# Patient Record
Sex: Female | Born: 1955 | Race: White | Hispanic: No | Marital: Married
Health system: Southern US, Community
[De-identification: ages and names within clinical notes are randomized; demographics above are authoritative.]

## PROBLEM LIST (undated history)

## (undated) HISTORY — PX: BREAST EXCISIONAL BIOPSY: SUR124

## (undated) HISTORY — PX: BREAST BIOPSY: SHX20

---

## 2015-02-05 ENCOUNTER — Other Ambulatory Visit: Payer: Self-pay

## 2015-02-05 DIAGNOSIS — Z1231 Encounter for screening mammogram for malignant neoplasm of breast: Secondary | ICD-10-CM

## 2015-02-25 ENCOUNTER — Ambulatory Visit
Admission: RE | Admit: 2015-02-25 | Discharge: 2015-02-25 | Disposition: A | Discharge status: Home / Self Care | Payer: PRIVATE HEALTH INSURANCE | Source: Ambulatory Visit

## 2015-02-25 DIAGNOSIS — Z1231 Encounter for screening mammogram for malignant neoplasm of breast: Secondary | ICD-10-CM

## 2016-02-10 ENCOUNTER — Other Ambulatory Visit: Payer: Self-pay | Admitting: Family Medicine

## 2016-02-10 DIAGNOSIS — Z1231 Encounter for screening mammogram for malignant neoplasm of breast: Secondary | ICD-10-CM

## 2016-03-31 ENCOUNTER — Ambulatory Visit
Admission: RE | Admit: 2016-03-31 | Discharge: 2016-03-31 | Disposition: A | Discharge status: Home / Self Care | Payer: No Typology Code available for payment source | Source: Ambulatory Visit | Attending: Family Medicine | Admitting: Family Medicine

## 2016-03-31 DIAGNOSIS — Z1231 Encounter for screening mammogram for malignant neoplasm of breast: Secondary | ICD-10-CM

## 2016-11-23 ENCOUNTER — Other Ambulatory Visit: Payer: Self-pay | Admitting: Physician Assistant

## 2016-11-23 DIAGNOSIS — M8580 Other specified disorders of bone density and structure, unspecified site: Secondary | ICD-10-CM

## 2017-03-22 ENCOUNTER — Other Ambulatory Visit: Payer: Self-pay | Admitting: Family Medicine

## 2017-03-22 DIAGNOSIS — Z1231 Encounter for screening mammogram for malignant neoplasm of breast: Secondary | ICD-10-CM

## 2017-04-09 ENCOUNTER — Other Ambulatory Visit: Payer: Self-pay | Admitting: Physician Assistant

## 2017-04-09 DIAGNOSIS — Z1231 Encounter for screening mammogram for malignant neoplasm of breast: Secondary | ICD-10-CM

## 2017-04-30 ENCOUNTER — Ambulatory Visit: Payer: No Typology Code available for payment source

## 2017-05-04 ENCOUNTER — Ambulatory Visit
Admission: RE | Admit: 2017-05-04 | Discharge: 2017-05-04 | Disposition: A | Discharge status: Home / Self Care | Payer: No Typology Code available for payment source | Source: Ambulatory Visit | Attending: Physician Assistant | Admitting: Physician Assistant

## 2017-05-04 DIAGNOSIS — Z1231 Encounter for screening mammogram for malignant neoplasm of breast: Secondary | ICD-10-CM

## 2017-05-04 DIAGNOSIS — M8580 Other specified disorders of bone density and structure, unspecified site: Secondary | ICD-10-CM

## 2018-03-27 ENCOUNTER — Other Ambulatory Visit: Payer: Self-pay | Admitting: Physician Assistant

## 2018-03-27 DIAGNOSIS — Z1231 Encounter for screening mammogram for malignant neoplasm of breast: Secondary | ICD-10-CM

## 2018-05-07 ENCOUNTER — Ambulatory Visit
Admission: RE | Admit: 2018-05-07 | Discharge: 2018-05-07 | Disposition: A | Discharge status: Home / Self Care | Payer: No Typology Code available for payment source | Source: Ambulatory Visit | Attending: Physician Assistant | Admitting: Physician Assistant

## 2018-05-07 DIAGNOSIS — Z1231 Encounter for screening mammogram for malignant neoplasm of breast: Secondary | ICD-10-CM

## 2019-05-30 ENCOUNTER — Other Ambulatory Visit: Payer: Self-pay | Admitting: Physician Assistant

## 2019-05-30 DIAGNOSIS — Z1231 Encounter for screening mammogram for malignant neoplasm of breast: Secondary | ICD-10-CM

## 2019-07-16 ENCOUNTER — Other Ambulatory Visit: Payer: Self-pay

## 2019-07-16 ENCOUNTER — Ambulatory Visit
Admission: RE | Admit: 2019-07-16 | Discharge: 2019-07-16 | Disposition: A | Discharge status: Home / Self Care | Payer: Medicare Other | Source: Ambulatory Visit | Attending: Physician Assistant | Admitting: Physician Assistant

## 2019-07-16 DIAGNOSIS — Z1231 Encounter for screening mammogram for malignant neoplasm of breast: Secondary | ICD-10-CM

## 2019-12-13 ENCOUNTER — Other Ambulatory Visit: Payer: Self-pay

## 2019-12-13 ENCOUNTER — Emergency Department (HOSPITAL_COMMUNITY)
Admission: EM | Admit: 2019-12-13 | Discharge: 2019-12-13 | Discharge status: Against Medical Advice | Payer: Medicare Other

## 2019-12-13 NOTE — ED Notes (Signed)
Called pt x1 for triage No answer 

## 2019-12-13 NOTE — ED Notes (Signed)
Pt gave labels to registration and left before triage

## 2020-04-03 IMAGING — MG DIGITAL SCREENING BILAT W/ TOMO W/ CAD
8 series · 9 of 24 positions shown · non-contrast
Comparison: Previous exam(s).

CLINICAL DATA: Screening.

EXAM:
DIGITAL SCREENING BILATERAL MAMMOGRAM WITH TOMO AND CAD

[L CC synth-2D]
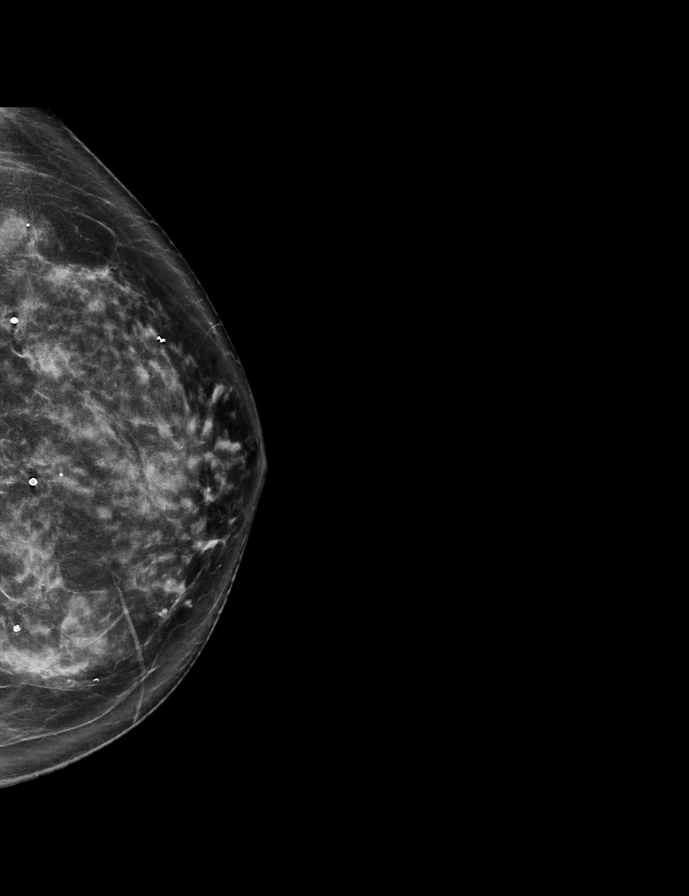

[L MLO synth-2D]
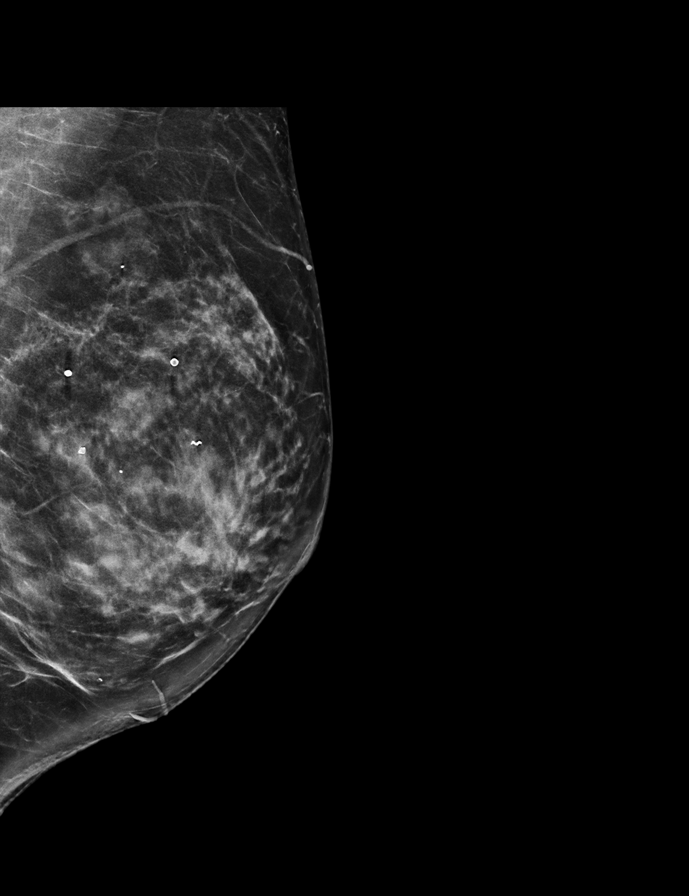

[R CC synth-2D]
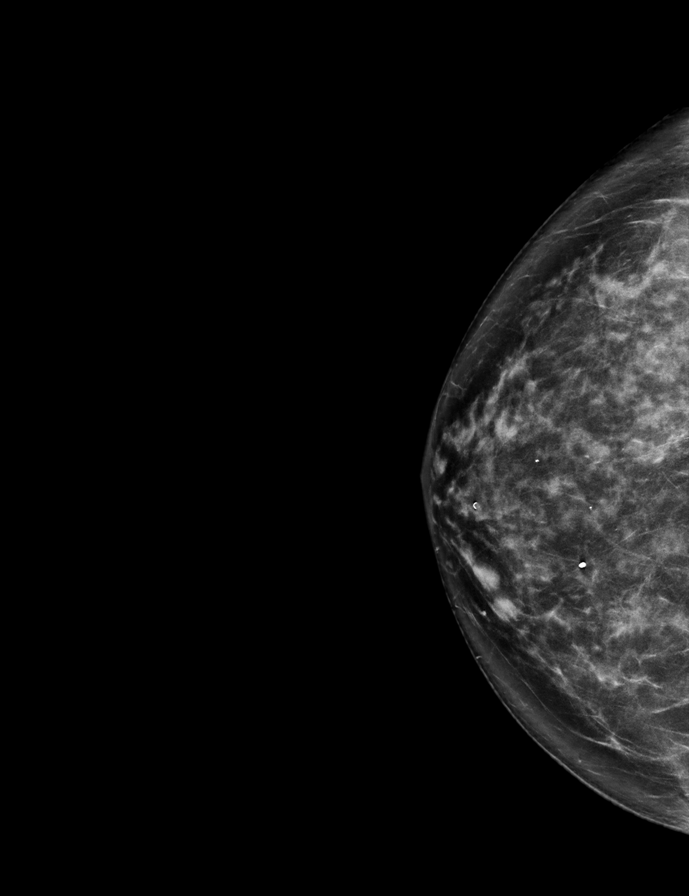

[R MLO synth-2D]
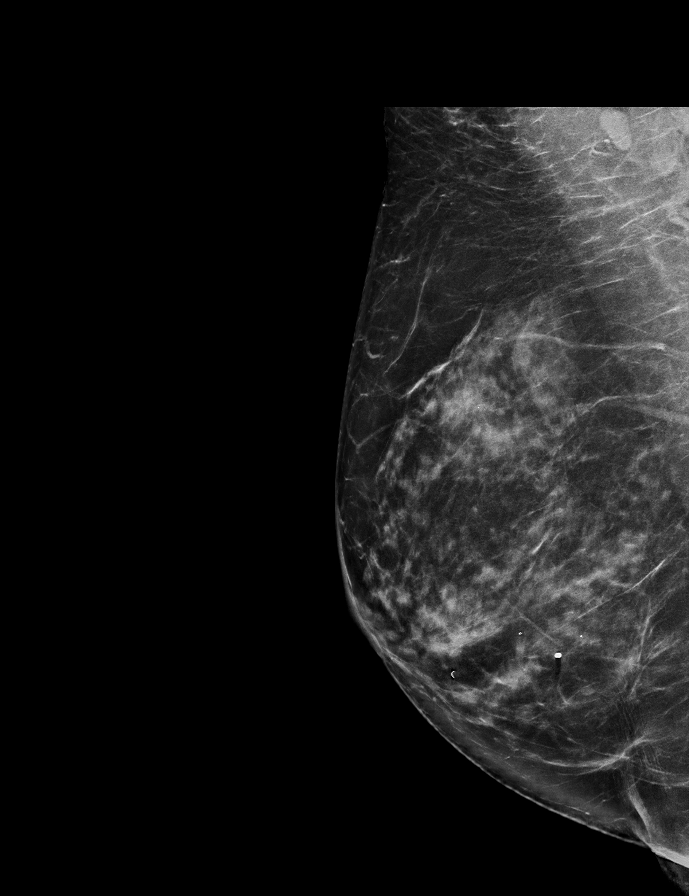

[L CC tomo · 2 of 71 frames shown]
[frame 23/71]
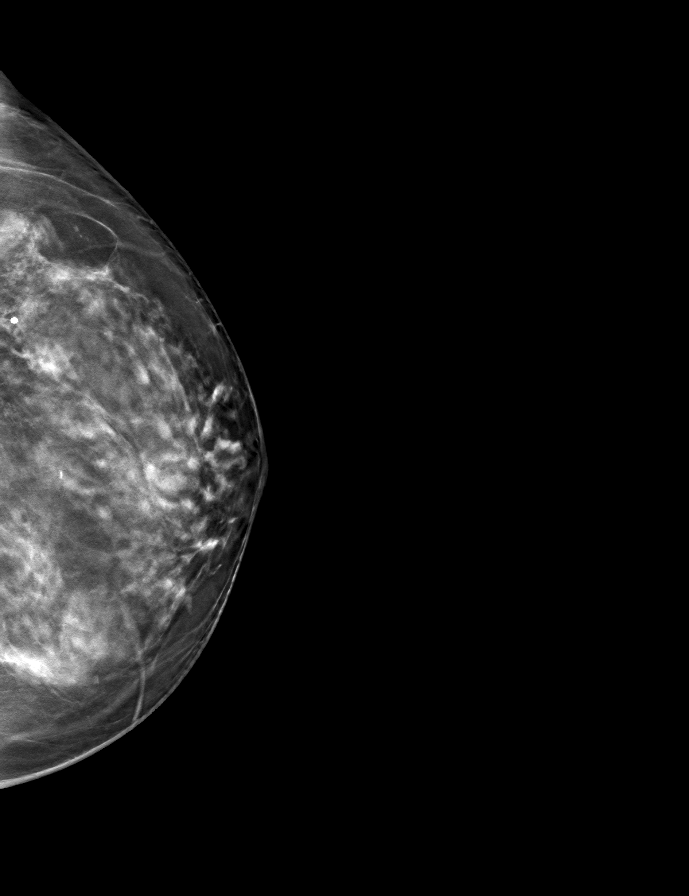
[frame 36/71]
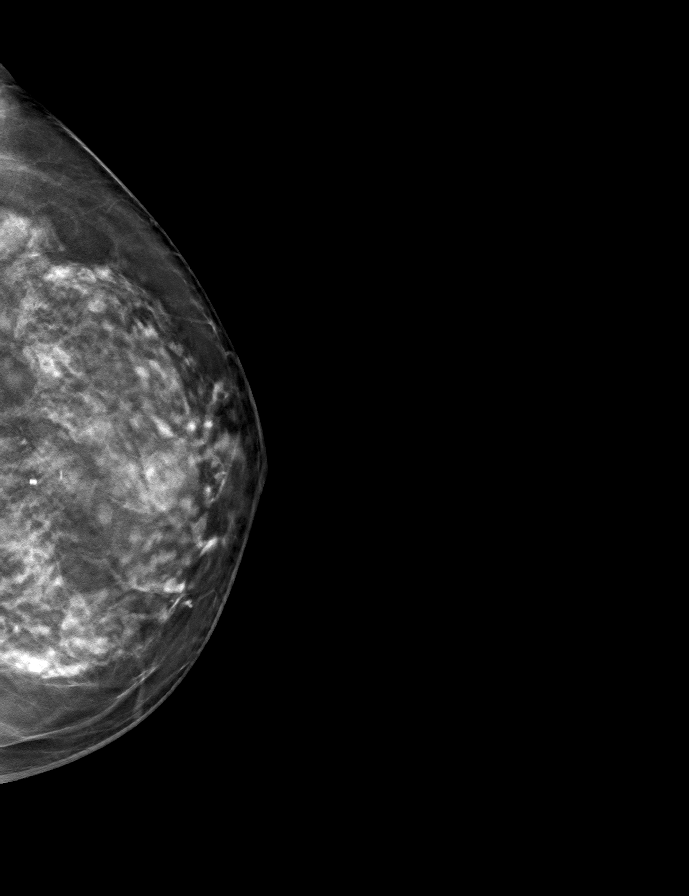

[R MLO tomo · tomo slice 38/75.0]
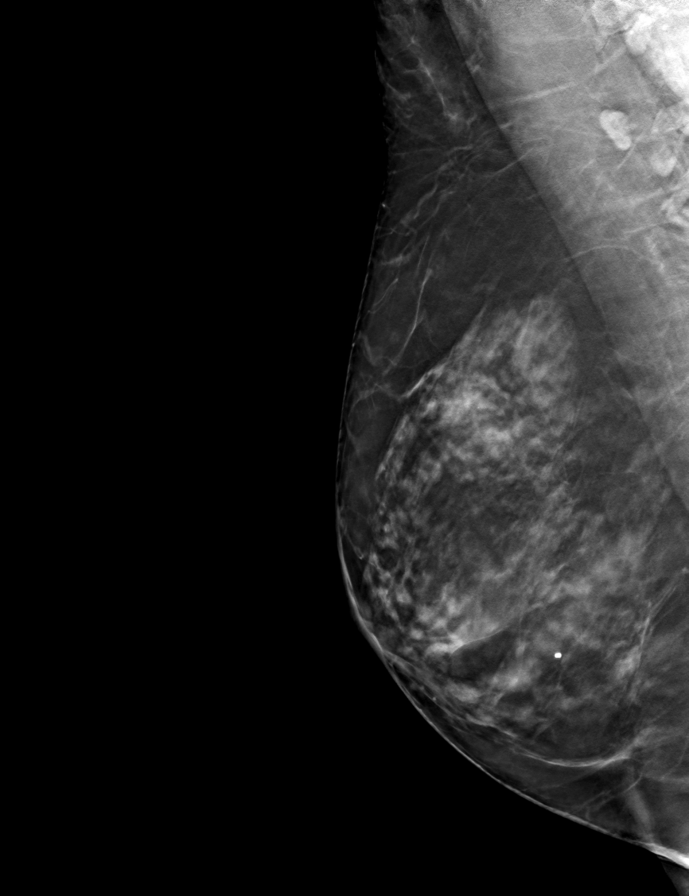

[R CC tomo · tomo slice 37/72.0]
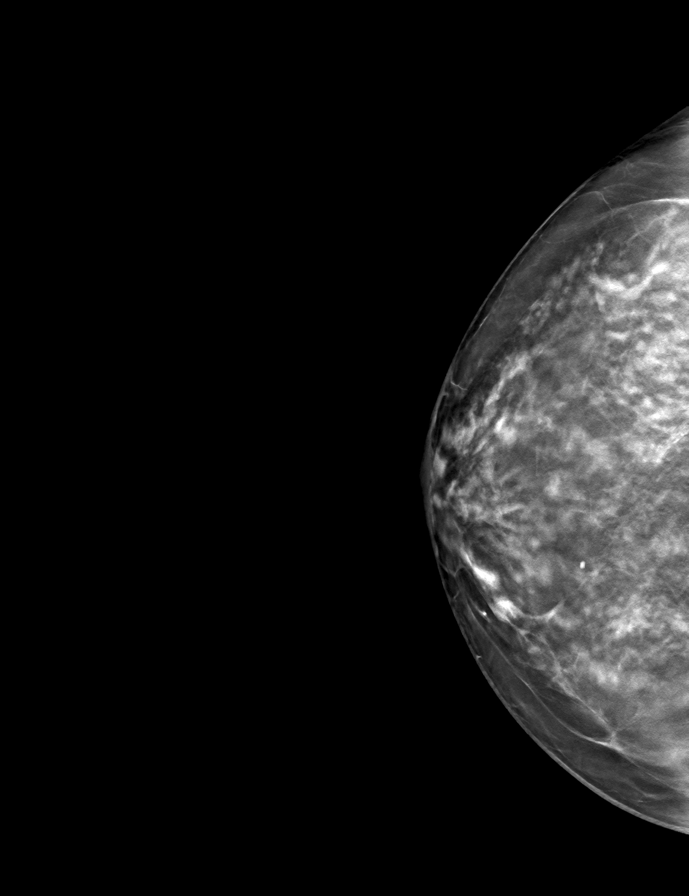

[L MLO tomo · tomo slice 35/69.0]
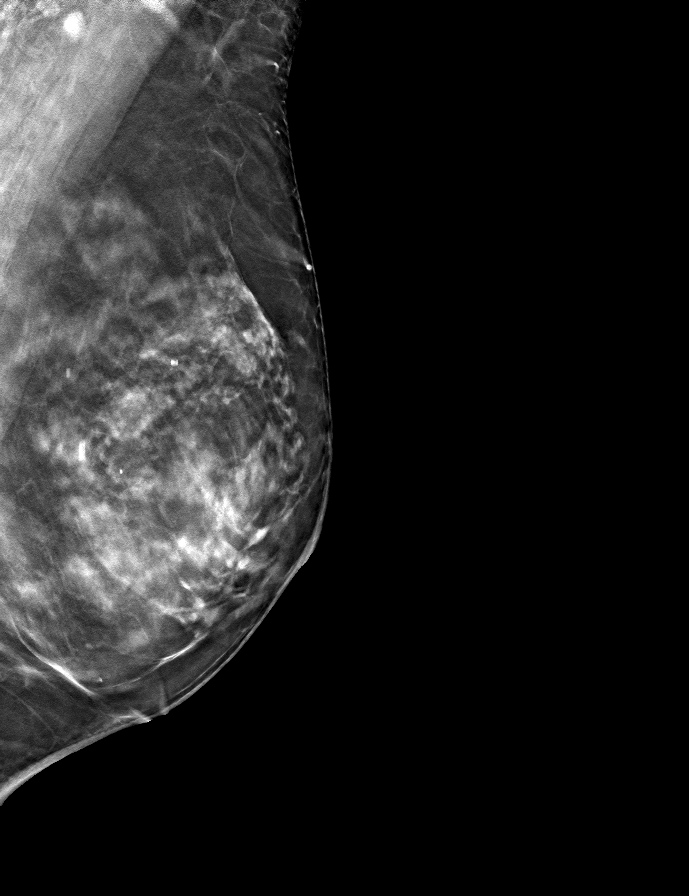

[9 of 24 positions shown; findings below may reference images not displayed]

ACR Breast Density Category c: The breast tissue is heterogeneously
dense, which may obscure small masses.
FINDINGS: There are no findings suspicious for malignancy. Images were
processed with CAD.
IMPRESSION: No mammographic evidence of malignancy. A result letter of this
screening mammogram will be mailed directly to the patient.

RECOMMENDATION:
Screening mammogram in one year. (Code:FT-U-LHB)

BI-RADS CATEGORY  1: Negative.

## 2020-06-16 ENCOUNTER — Other Ambulatory Visit: Payer: Self-pay | Admitting: Physician Assistant

## 2020-06-16 DIAGNOSIS — E2839 Other primary ovarian failure: Secondary | ICD-10-CM

## 2020-06-16 DIAGNOSIS — Z1231 Encounter for screening mammogram for malignant neoplasm of breast: Secondary | ICD-10-CM

## 2020-11-08 ENCOUNTER — Ambulatory Visit: Payer: No Typology Code available for payment source

## 2020-11-08 ENCOUNTER — Other Ambulatory Visit: Payer: No Typology Code available for payment source

## 2020-11-26 ENCOUNTER — Other Ambulatory Visit: Payer: Self-pay | Admitting: Physician Assistant

## 2020-11-26 DIAGNOSIS — E041 Nontoxic single thyroid nodule: Secondary | ICD-10-CM

## 2020-12-01 ENCOUNTER — Other Ambulatory Visit: Payer: No Typology Code available for payment source

## 2020-12-08 ENCOUNTER — Other Ambulatory Visit: Payer: Medicare Other

## 2020-12-09 ENCOUNTER — Ambulatory Visit
Admission: RE | Admit: 2020-12-09 | Discharge: 2020-12-09 | Disposition: A | Discharge status: Home / Self Care | Payer: Medicare Other | Source: Ambulatory Visit | Attending: Physician Assistant | Admitting: Physician Assistant

## 2020-12-09 ENCOUNTER — Other Ambulatory Visit: Payer: Self-pay

## 2020-12-09 DIAGNOSIS — E041 Nontoxic single thyroid nodule: Secondary | ICD-10-CM

## 2020-12-15 ENCOUNTER — Ambulatory Visit
Admission: RE | Admit: 2020-12-15 | Discharge: 2020-12-15 | Disposition: A | Discharge status: Home / Self Care | Payer: Medicare Other | Source: Ambulatory Visit | Attending: Physician Assistant | Admitting: Physician Assistant

## 2020-12-15 ENCOUNTER — Other Ambulatory Visit: Payer: Self-pay

## 2020-12-15 DIAGNOSIS — Z1231 Encounter for screening mammogram for malignant neoplasm of breast: Secondary | ICD-10-CM

## 2021-04-06 ENCOUNTER — Ambulatory Visit
Admission: RE | Admit: 2021-04-06 | Discharge: 2021-04-06 | Disposition: A | Discharge status: Home / Self Care | Payer: Medicare Other | Source: Ambulatory Visit | Attending: Physician Assistant | Admitting: Physician Assistant

## 2021-04-06 DIAGNOSIS — E2839 Other primary ovarian failure: Secondary | ICD-10-CM

## 2021-08-28 IMAGING — US US THYROID
1 series · 13 of 25 positions shown · non-contrast
Comparison: None.

CLINICAL DATA: Thyroid nodule

EXAM:
THYROID ULTRASOUND
TECHNIQUE: Ultrasound examination of the thyroid gland and adjacent soft
tissues was performed.

[Series 1: us thyroid · 0.04mm/px · 13 of 45 slices shown]
[im 1/45]
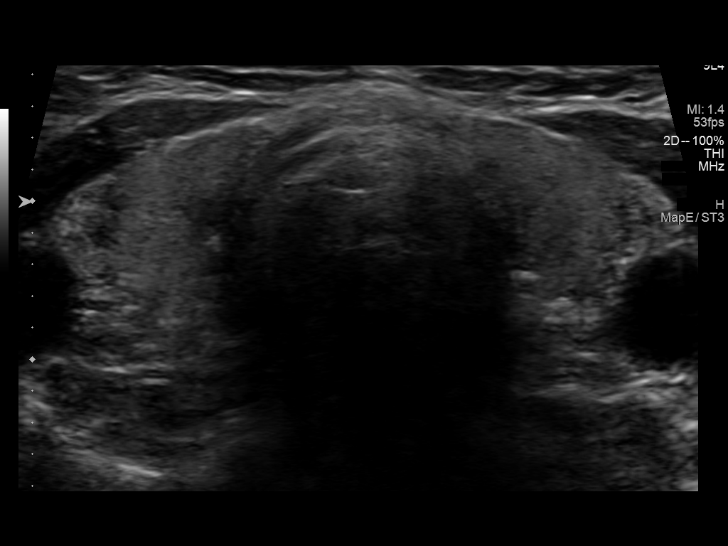
[im 4/45]
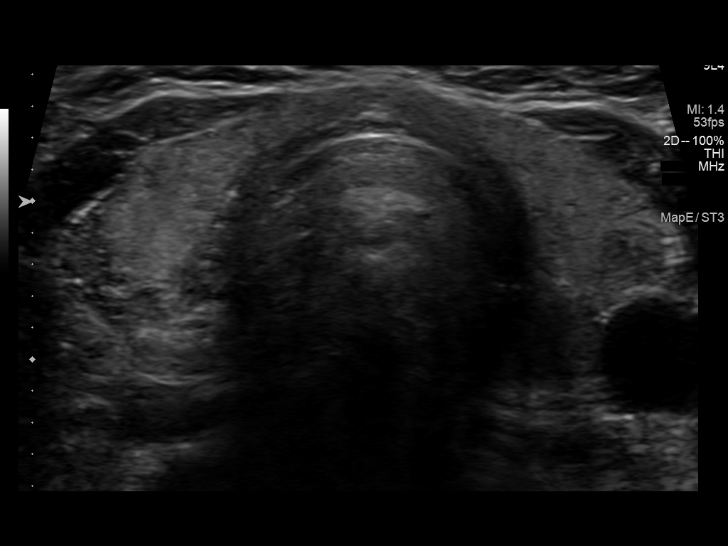
[im 8/45]
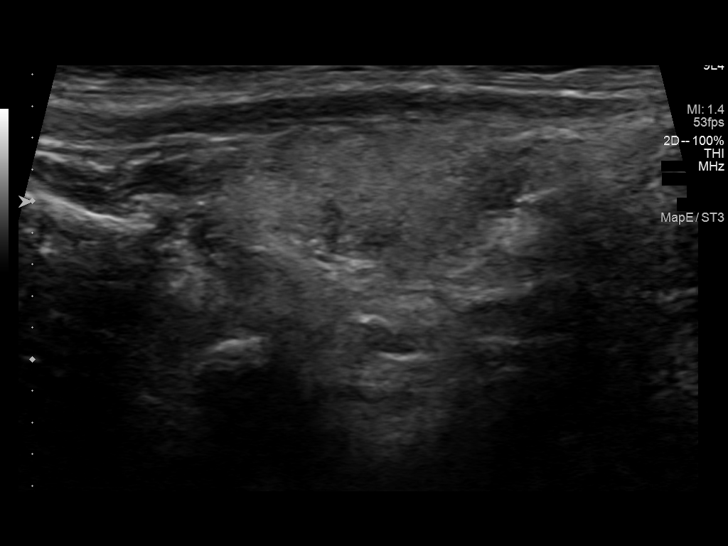
[im 12/45]
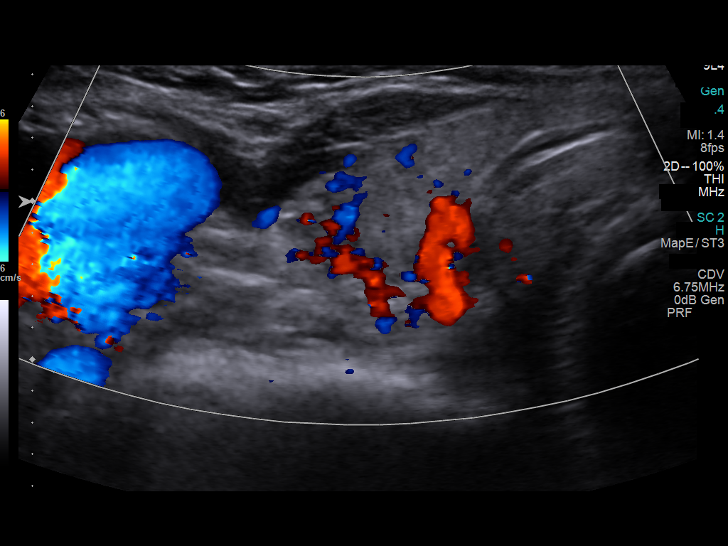
[im 15/45]
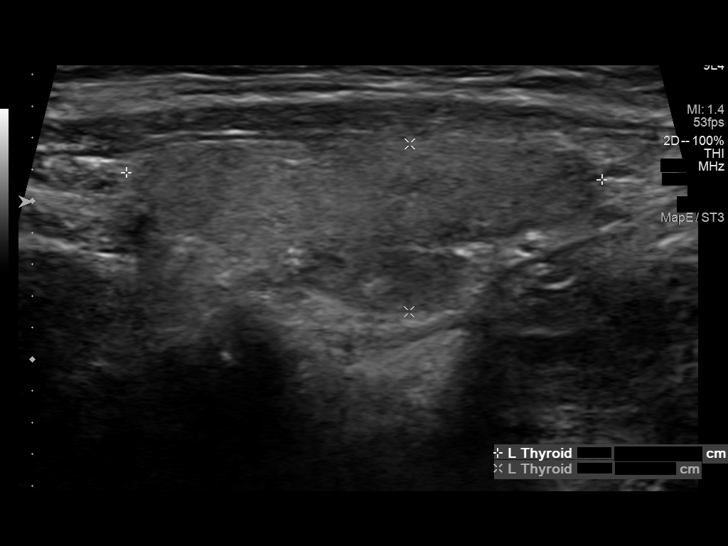
[im 19/45]
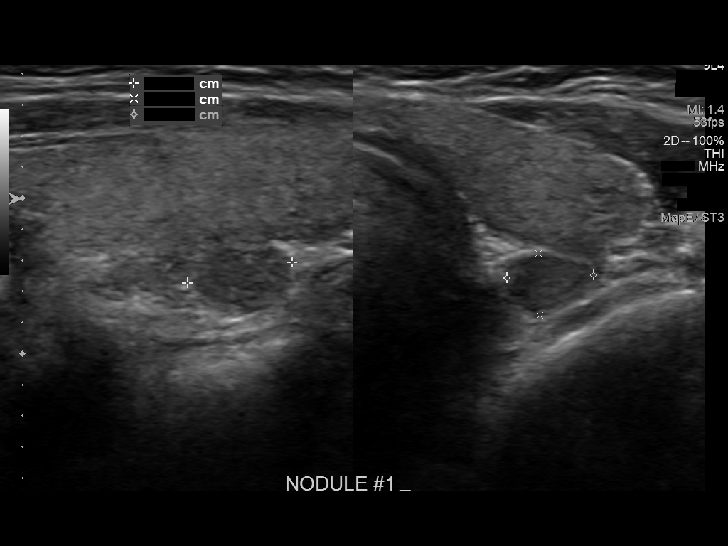
[im 23/45]
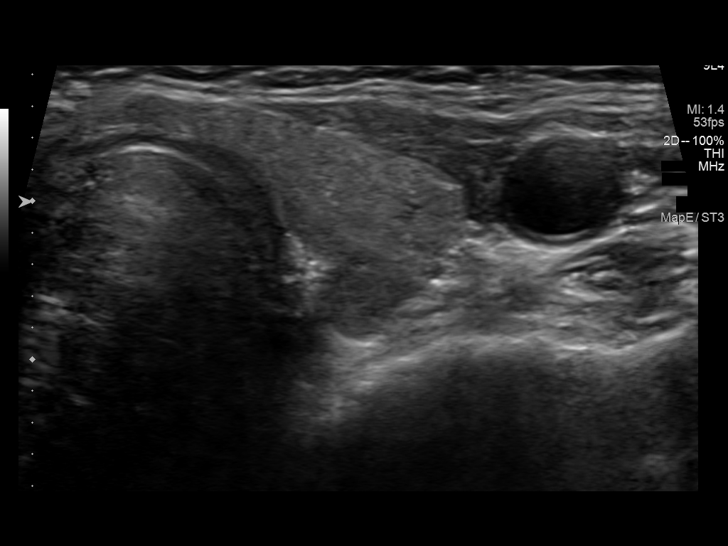
[im 26/45]
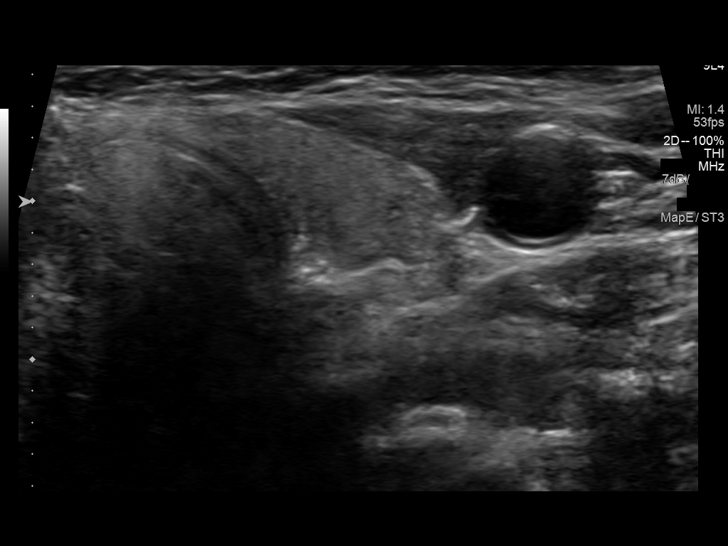
[im 30/45]
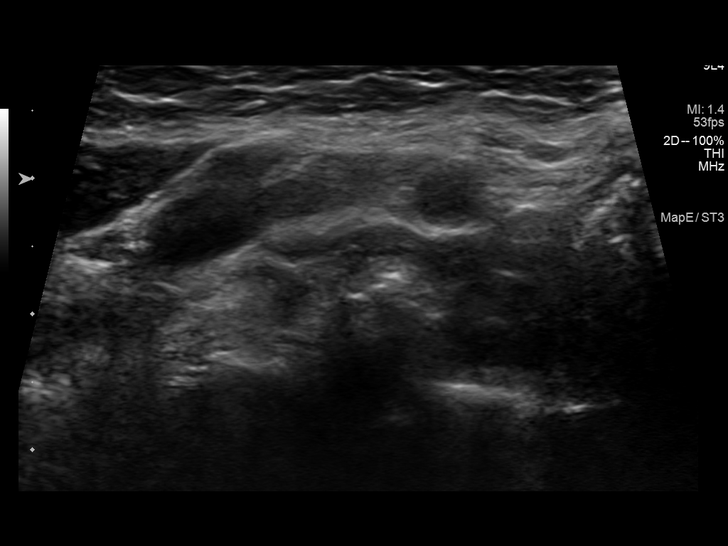
[im 34/45]
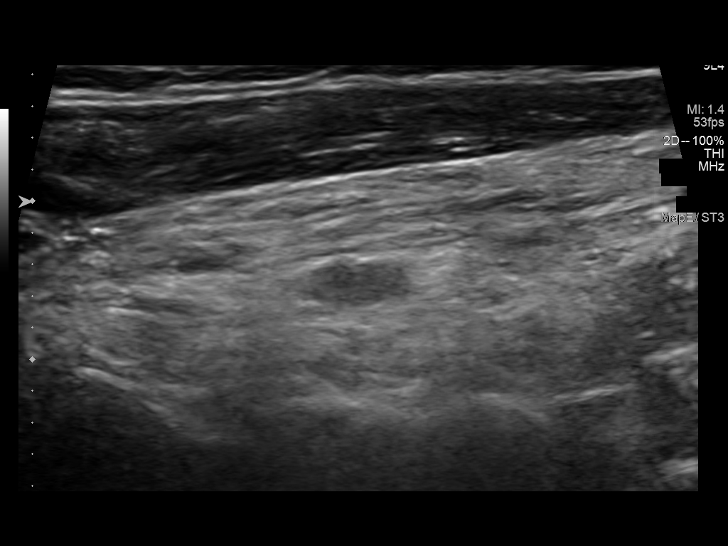
[im 37/45]
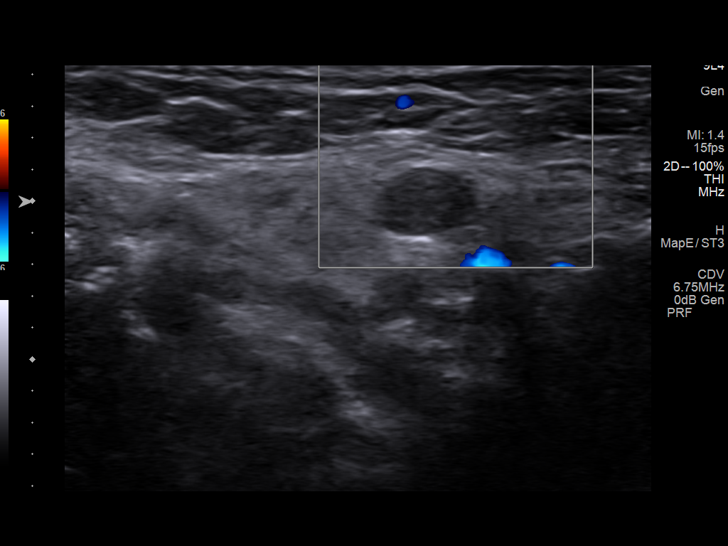
[im 41/45]
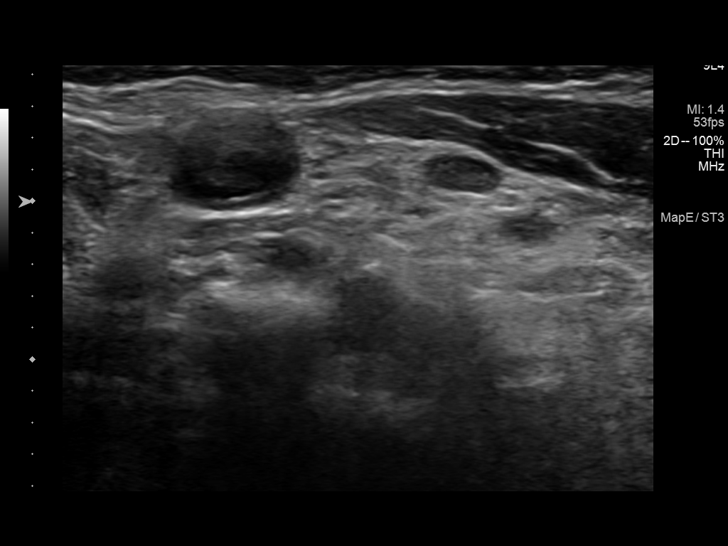
[im 45/45]
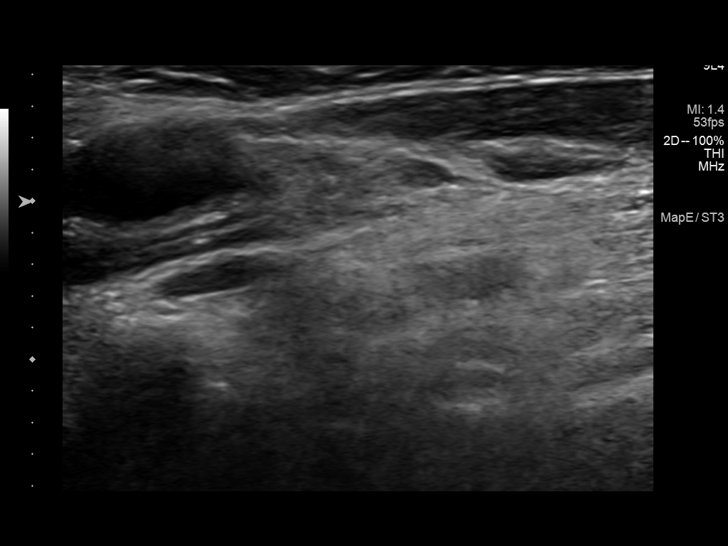

[13 of 25 positions shown; findings below may reference images not displayed]

FINDINGS: Parenchymal Echotexture: Mildly heterogeneous

Isthmus: 0.2 cm

Right lobe: 2.7 x 1.2 x 1.1 cm

Left lobe: 3.0 x 1.2 x 1.2 cm

_________________________________________________________

Estimated total number of nodules >/= 1 cm: 0

Number of spongiform nodules >/=  2 cm not described below (TR1): 0

Number of mixed cystic and solid nodules >/= 1.5 cm not described
below (TR2): 0

_________________________________________________________

0.7 x 0.6 x 0.4 cm hypoechoic nodule seen within the inferior aspect
of the left thyroid lobe are adjacent to it. No additional thyroid
nodules identified.
IMPRESSION: 0.7 x 0.6 x 0.4 cm solid hypoechoic nodule is located within or
adjacent to the inferior aspect of the left thyroid lobe. At the
thyroid nodule, it does not meet criteria for FNA or imaging
surveillance. Alternatively this could represent a parathyroid
adenoma. Please correlate with laboratory values for
hyperparathyroidism.

The above is in keeping with the ACR TI-RADS recommendations - [HOSPITAL] 6650;[DATE].

## 2021-12-21 ENCOUNTER — Other Ambulatory Visit: Payer: Self-pay | Admitting: Physician Assistant

## 2021-12-21 DIAGNOSIS — Z1231 Encounter for screening mammogram for malignant neoplasm of breast: Secondary | ICD-10-CM

## 2022-04-19 ENCOUNTER — Ambulatory Visit
Admission: RE | Admit: 2022-04-19 | Discharge: 2022-04-19 | Disposition: A | Discharge status: Home / Self Care | Payer: Medicare Other | Source: Ambulatory Visit | Attending: Physician Assistant | Admitting: Physician Assistant

## 2022-04-19 DIAGNOSIS — Z1231 Encounter for screening mammogram for malignant neoplasm of breast: Secondary | ICD-10-CM

## 2022-04-20 ENCOUNTER — Other Ambulatory Visit: Payer: Self-pay | Admitting: Physician Assistant

## 2022-04-20 DIAGNOSIS — R928 Other abnormal and inconclusive findings on diagnostic imaging of breast: Secondary | ICD-10-CM

## 2022-05-03 ENCOUNTER — Ambulatory Visit: Payer: Medicare Other

## 2022-05-03 ENCOUNTER — Ambulatory Visit
Admission: RE | Admit: 2022-05-03 | Discharge: 2022-05-03 | Disposition: A | Discharge status: Home / Self Care | Payer: Medicare Other | Source: Ambulatory Visit | Attending: Physician Assistant | Admitting: Physician Assistant

## 2022-05-03 DIAGNOSIS — R928 Other abnormal and inconclusive findings on diagnostic imaging of breast: Secondary | ICD-10-CM

## 2023-04-02 ENCOUNTER — Other Ambulatory Visit: Payer: Self-pay | Admitting: Family Medicine

## 2023-04-02 DIAGNOSIS — Z13820 Encounter for screening for osteoporosis: Secondary | ICD-10-CM

## 2023-04-05 ENCOUNTER — Other Ambulatory Visit: Payer: Self-pay | Admitting: Family Medicine

## 2023-04-05 DIAGNOSIS — M810 Age-related osteoporosis without current pathological fracture: Secondary | ICD-10-CM

## 2023-04-09 ENCOUNTER — Other Ambulatory Visit: Payer: Self-pay | Admitting: Family Medicine

## 2023-04-09 DIAGNOSIS — Z1231 Encounter for screening mammogram for malignant neoplasm of breast: Secondary | ICD-10-CM

## 2023-05-11 ENCOUNTER — Ambulatory Visit
Admission: RE | Admit: 2023-05-11 | Discharge: 2023-05-11 | Disposition: A | Discharge status: Home / Self Care | Payer: Medicare Other | Source: Ambulatory Visit | Attending: Family Medicine | Admitting: Family Medicine

## 2023-05-11 DIAGNOSIS — Z1231 Encounter for screening mammogram for malignant neoplasm of breast: Secondary | ICD-10-CM

## 2023-11-26 ENCOUNTER — Other Ambulatory Visit: Payer: Medicare Other

## 2024-04-16 ENCOUNTER — Other Ambulatory Visit: Payer: Self-pay | Admitting: Family Medicine

## 2024-04-16 DIAGNOSIS — Z1231 Encounter for screening mammogram for malignant neoplasm of breast: Secondary | ICD-10-CM

## 2024-05-16 ENCOUNTER — Ambulatory Visit
Admission: RE | Admit: 2024-05-16 | Discharge: 2024-05-16 | Disposition: A | Discharge status: Home / Self Care | Source: Ambulatory Visit | Attending: Family Medicine | Admitting: Family Medicine

## 2024-05-16 DIAGNOSIS — Z1231 Encounter for screening mammogram for malignant neoplasm of breast: Secondary | ICD-10-CM
